# Patient Record
Sex: Female | Born: 1970
Health system: Southern US, Community
[De-identification: ages and names within clinical notes are randomized; demographics above are authoritative.]

## PROBLEM LIST (undated history)

## (undated) DIAGNOSIS — I10 Essential (primary) hypertension: Secondary | ICD-10-CM

## (undated) HISTORY — PX: TONSILLECTOMY: SUR1361

---

## 2018-02-02 DIAGNOSIS — J3089 Other allergic rhinitis: Secondary | ICD-10-CM | POA: Diagnosis not present

## 2018-02-02 DIAGNOSIS — J019 Acute sinusitis, unspecified: Secondary | ICD-10-CM | POA: Diagnosis not present

## 2018-10-15 DIAGNOSIS — T148XXA Other injury of unspecified body region, initial encounter: Secondary | ICD-10-CM | POA: Diagnosis not present

## 2018-10-19 DIAGNOSIS — N912 Amenorrhea, unspecified: Secondary | ICD-10-CM | POA: Diagnosis not present

## 2018-10-19 DIAGNOSIS — R69 Illness, unspecified: Secondary | ICD-10-CM | POA: Diagnosis not present

## 2018-10-19 DIAGNOSIS — S39011D Strain of muscle, fascia and tendon of abdomen, subsequent encounter: Secondary | ICD-10-CM | POA: Diagnosis not present

## 2018-10-19 DIAGNOSIS — Z01411 Encounter for gynecological examination (general) (routine) with abnormal findings: Secondary | ICD-10-CM | POA: Diagnosis not present

## 2018-10-19 DIAGNOSIS — Z01419 Encounter for gynecological examination (general) (routine) without abnormal findings: Secondary | ICD-10-CM | POA: Diagnosis not present

## 2019-01-04 DIAGNOSIS — R69 Illness, unspecified: Secondary | ICD-10-CM | POA: Diagnosis not present

## 2019-11-28 DIAGNOSIS — Z01419 Encounter for gynecological examination (general) (routine) without abnormal findings: Secondary | ICD-10-CM | POA: Diagnosis not present

## 2020-01-14 DIAGNOSIS — I10 Essential (primary) hypertension: Secondary | ICD-10-CM | POA: Diagnosis not present

## 2020-07-29 DIAGNOSIS — I10 Essential (primary) hypertension: Secondary | ICD-10-CM | POA: Diagnosis not present

## 2020-08-05 DIAGNOSIS — R519 Headache, unspecified: Secondary | ICD-10-CM | POA: Diagnosis not present

## 2020-08-06 ENCOUNTER — Emergency Department (INDEPENDENT_AMBULATORY_CARE_PROVIDER_SITE_OTHER): Payer: 59

## 2020-08-06 ENCOUNTER — Emergency Department
Admission: RE | Admit: 2020-08-06 | Discharge: 2020-08-06 | Disposition: A | Discharge status: Home / Self Care | Payer: Self-pay | Source: Ambulatory Visit | Attending: Family Medicine | Admitting: Family Medicine

## 2020-08-06 ENCOUNTER — Other Ambulatory Visit: Payer: Self-pay

## 2020-08-06 VITALS — BP 150/102 | HR 89 | Temp 98.9°F | Resp 16 | Ht 71.0 in | Wt 125.0 lb

## 2020-08-06 DIAGNOSIS — M542 Cervicalgia: Secondary | ICD-10-CM | POA: Diagnosis not present

## 2020-08-06 DIAGNOSIS — R519 Headache, unspecified: Secondary | ICD-10-CM

## 2020-08-06 DIAGNOSIS — M4802 Spinal stenosis, cervical region: Secondary | ICD-10-CM | POA: Diagnosis not present

## 2020-08-06 DIAGNOSIS — G44209 Tension-type headache, unspecified, not intractable: Secondary | ICD-10-CM | POA: Diagnosis not present

## 2020-08-06 HISTORY — DX: Essential (primary) hypertension: I10

## 2020-08-06 NOTE — Discharge Instructions (Addendum)
May take Tylenol once or twice daily as needed.  Try applying a heating pad once or twice daily.

## 2020-08-06 NOTE — ED Provider Notes (Signed)
[facility]    CSN: [id number] Arrival date & time: 08/06/20  0941      History   Chief Complaint Chief Complaint  Patient presents with  . Headache    appt 10AM    HPI Madison Mccall is a 48 y.o. female.   Patient complains of three week history of right neck and right occipital headache. She recalls no injury to her neck.  She denies neurologic symptoms.  Her pain is improved somewhat after taking Ibuprofen and Tylenol.  The history is provided by the patient.  Headache Pain location:  Occipital Quality:  Dull Radiates to:  R neck Onset quality:  Gradual Duration:  3 weeks Timing:  Constant Progression:  Unchanged Chronicity:  New Similar to prior headaches: no   Context: activity   Context: not exposure to bright light, not coughing, not defecating, not eating and not straining   Relieved by:  Acetaminophen and NSAIDs Exacerbated by: neck movement. Ineffective treatments:  None tried Associated symptoms: neck pain and neck stiffness   Associated symptoms: no back pain, no blurred vision, no dizziness, no ear pain, no eye pain, no fatigue, no fever, no focal weakness, no hearing loss, no loss of balance, no myalgias, no nausea, no near-syncope, no numbness, no paresthesias, no photophobia, no seizures, no swollen glands, no syncope, no tingling, no visual change and no weakness     Past Medical History:  Diagnosis Date  . Hypertension     There are no problems to display for this patient.   Past Surgical History:  Procedure Laterality Date  . TONSILLECTOMY      OB History   No obstetric history on file.      Home Medications    Prior to Admission medications   Medication Sig Start Date End Date Taking? Authorizing Provider  levonorgestrel (MIRENA) 20 MCG/24HR IUD 1 each by Intrauterine route once.   Yes [provider]  hydrochlorothiazide (MICROZIDE) 12.5 MG capsule Take 12.5 mg by mouth daily. 07/29/20   [provider]     Family History Family History  Problem Relation Age of Onset  . Hypertension Mother     Social History Social History   Tobacco Use  . Smoking status: Never Smoker  . Smokeless tobacco: Never Used  Vaping Use  . Vaping Use: Never used  Substance Use Topics  . Alcohol use: Never  . Drug use: Never     Allergies   Amoxicillin-pot clavulanate and Sulfa antibiotics   Review of Systems Review of Systems  Constitutional: Negative for activity change, chills, fatigue and fever.  HENT: Negative for ear pain and hearing loss.   Eyes: Negative for blurred vision, photophobia, pain and visual disturbance.  Respiratory: Negative.   Cardiovascular: Negative.  Negative for syncope and near-syncope.  Gastrointestinal: Negative.  Negative for nausea.  Genitourinary: Negative.   Musculoskeletal: Positive for neck pain and neck stiffness. Negative for back pain and myalgias.  Skin: Negative.   Neurological: Positive for headaches. Negative for dizziness, tremors, focal weakness, seizures, syncope, facial asymmetry, speech difficulty, weakness, light-headedness, numbness, paresthesias and loss of balance.     Physical Exam Triage Vital Signs ED Triage Vitals  Enc Vitals Group     BP 08/06/20 1000 (!) 150/102     Pulse Rate 08/06/20 1000 89     Resp 08/06/20 1000 16     Temp 08/06/20 1000 98.9 F (37.2 C)     Temp Source 08/06/20 1000 Oral     SpO2  08/06/20 1000 97 %     Weight 08/06/20 0954 125 lb (56.7 kg)     Height 08/06/20 0954 5\' 11"  (1.803 m)     Head Circumference --      Peak Flow --      Pain Score 08/06/20 0954 5     Pain Loc --      Pain Edu? --      Excl. in GC? --    No data found.  Updated Vital Signs BP (!) 150/102 (BP Location: Right Arm)   Pulse 89   Temp 98.9 F (37.2 C) (Oral)   Resp 16   Ht 5\' 11"  (1.803 m)   Wt 56.7 kg   SpO2 97%   BMI 17.43 kg/m   Visual Acuity Right Eye Distance:   Left Eye Distance:   Bilateral Distance:     Right Eye Near:   Left Eye Near:    Bilateral Near:     Physical Exam Vitals and nursing note reviewed.  Constitutional:      General: She is not in acute distress.    Appearance: She is not ill-appearing.  HENT:     Head: Normocephalic.      Comments: Point tenderness to palpation right occipital area extending to right posterior neck as noted on diagram.  No skull defects palpated.  Patient's pain is increased when she extends her neck, flexes her neck to the left, and rotates her head to the left.    Right Ear: Tympanic membrane, ear canal and external ear normal.     Left Ear: Tympanic membrane, ear canal and external ear normal.     Nose: Nose normal.     Mouth/Throat:     Pharynx: Oropharynx is clear.  Eyes:     Extraocular Movements: Extraocular movements intact.     Conjunctiva/sclera: Conjunctivae normal.     Pupils: Pupils are equal, round, and reactive to light.  Neck:     Vascular: No carotid bruit.  Cardiovascular:     Rate and Rhythm: Normal rate and regular rhythm.     Heart sounds: Normal heart sounds.  Pulmonary:     Breath sounds: Normal breath sounds.  Abdominal:     Palpations: Abdomen is soft.     Tenderness: There is no abdominal tenderness.  Musculoskeletal:        General: No tenderness.     Cervical back: Normal range of motion and neck supple.     Right lower leg: No edema.     Left lower leg: No edema.  Lymphadenopathy:     Cervical: No cervical adenopathy.  Skin:    General: Skin is warm and dry.     Findings: No rash.  Neurological:     Mental Status: She is alert.     Cranial Nerves: Cranial nerves are intact.     Sensory: Sensation is intact.     Motor: Motor function is intact.     Coordination: Coordination is intact.     Gait: Gait is intact.     Deep Tendon Reflexes: Reflexes are normal and symmetric.      UC Treatments / Results  Labs (all labs ordered are listed, but only abnormal results are displayed) Labs Reviewed  - No data to display  EKG   Radiology DG Cervical Spine Complete  Result Date: 08/06/2020 CLINICAL DATA:  49 year old female with pain and tenderness in the occipital region EXAM: CERVICAL SPINE - COMPLETE 4+ VIEW COMPARISON:  None. FINDINGS: Cervical  Spine: Cervical elements maintain relative anatomic alignment from the level of C1-T1. Unremarkable appearance of the craniocervical junction. No subluxation, anterolisthesis, retrolisthesis. Oblique images demonstrate no significant foraminal narrowing. No acute fracture line identified. Vertebral body heights maintained. Mild disc space narrowing. No significant uncovertebral joint disease or anterior osteophytosis. No significant facet disease. Open mouth odontoid view and submental vertex view unremarkable. Calcification within the posterior soft tissues of the neck overlying C5, nonspecific not related to acute fracture. Prevertebral soft tissues within normal limits. IMPRESSION: Negative for acute fracture or malalignment of the cervical spine. Electronically Signed   By: Gilmer Mor D.O.   On: 08/06/2020 12:15   CT Head Wo Contrast  Result Date: 08/06/2020 CLINICAL DATA:  Headache, new or worsening. EXAM: CT HEAD WITHOUT CONTRAST TECHNIQUE: Contiguous axial images were obtained from the base of the skull through the vertex without intravenous contrast. COMPARISON:  None. FINDINGS: Brain: No evidence of acute large vascular territory infarction, hemorrhage, hydrocephalus, extra-axial collection or mass lesion/mass effect. Cavum septum pellucidum et vergae, anatomic variant. Vascular: No hyperdense vessel or unexpected calcification. Skull: No acute fracture. Sinuses/Orbits: Visualized sinuses are clear. Unremarkable visualized orbits. Other: No mastoid effusions. IMPRESSION: No evidence of acute intracranial abnormality. Electronically Signed   By: Feliberto Harts MD   On: 08/06/2020 11:09    Procedures Procedures (including critical care  time)  Medications Ordered in UC Medications - No data to display  Initial Impression / Assessment and Plan / UC Course  I have reviewed the triage vital signs and the nursing notes.  Pertinent labs & imaging results that were available during my care of the patient were reviewed by me and considered in my medical decision making (see chart for details).    Review of records reveals no previous complaints of headache. Patient's pain pattern and exam are somewhat suggestive of cervical radiculopathy.  However, note unremarkable C-spine films. Followup with Family Doctor if not improved in about two weeks.   Final Clinical Impressions(s) / UC Diagnoses   Final diagnoses:  Tension headache     Discharge Instructions     May take Tylenol once or twice daily as needed.  Try applying a heating pad once or twice daily.    ED Prescriptions    None        Lattie Haw, MD 08/11/20 Madison Mccall

## 2020-08-06 NOTE — ED Triage Notes (Addendum)
Pt c/o HA on the back RT side of her head x 3 wks. Taken Advil and Tylenol when pain is worst with some relief. Reports mildly elevated BP and started HCTZ on Monday. Reports nosebleed today,but related that to the dry weather.

## 2020-09-19 DIAGNOSIS — Z20822 Contact with and (suspected) exposure to covid-19: Secondary | ICD-10-CM | POA: Diagnosis not present

## 2020-09-21 DIAGNOSIS — R0981 Nasal congestion: Secondary | ICD-10-CM | POA: Diagnosis not present

## 2020-09-21 DIAGNOSIS — M791 Myalgia, unspecified site: Secondary | ICD-10-CM | POA: Diagnosis not present

## 2020-09-21 DIAGNOSIS — Z20822 Contact with and (suspected) exposure to covid-19: Secondary | ICD-10-CM | POA: Diagnosis not present

## 2021-02-18 DIAGNOSIS — Z01419 Encounter for gynecological examination (general) (routine) without abnormal findings: Secondary | ICD-10-CM | POA: Diagnosis not present

## 2021-03-06 DIAGNOSIS — Z1231 Encounter for screening mammogram for malignant neoplasm of breast: Secondary | ICD-10-CM | POA: Diagnosis not present

## 2021-09-14 ENCOUNTER — Emergency Department
Admission: RE | Admit: 2021-09-14 | Discharge: 2021-09-14 | Disposition: A | Discharge status: Home / Self Care | Payer: 59 | Source: Ambulatory Visit

## 2021-09-14 ENCOUNTER — Emergency Department (INDEPENDENT_AMBULATORY_CARE_PROVIDER_SITE_OTHER): Payer: 59

## 2021-09-14 ENCOUNTER — Other Ambulatory Visit: Payer: Self-pay

## 2021-09-14 VITALS — BP 144/91 | HR 95 | Temp 98.5°F | Resp 14

## 2021-09-14 DIAGNOSIS — R109 Unspecified abdominal pain: Secondary | ICD-10-CM

## 2021-09-14 DIAGNOSIS — S39012A Strain of muscle, fascia and tendon of lower back, initial encounter: Secondary | ICD-10-CM | POA: Diagnosis not present

## 2021-09-14 DIAGNOSIS — M545 Low back pain, unspecified: Secondary | ICD-10-CM | POA: Diagnosis not present

## 2021-09-14 DIAGNOSIS — R3915 Urgency of urination: Secondary | ICD-10-CM | POA: Diagnosis not present

## 2021-09-14 LAB — POCT URINALYSIS DIP (MANUAL ENTRY)
Bilirubin, UA: NEGATIVE
Blood, UA: NEGATIVE
Glucose, UA: NEGATIVE mg/dL
Ketones, POC UA: NEGATIVE mg/dL
Leukocytes, UA: NEGATIVE
Nitrite, UA: NEGATIVE
Protein Ur, POC: NEGATIVE mg/dL
Spec Grav, UA: 1.015 (ref 1.010–1.025)
Urobilinogen, UA: 0.2 E.U./dL
pH, UA: 7 (ref 5.0–8.0)

## 2021-09-14 MED ORDER — BACLOFEN 10 MG PO TABS: 10.0000 mg | ORAL_TABLET | Freq: Three times a day (TID) | ORAL | 0 refills | Status: DC

## 2021-09-14 NOTE — ED Triage Notes (Signed)
Pt presents with lower back pain and urinary urgency that began 1.5 weeks ago. Pt states she also has a knot in her abdomen

## 2021-09-14 NOTE — ED Provider Notes (Signed)
[facility]    CSN: [id number] Arrival date & time: 09/14/21  0944      History   Chief Complaint Chief Complaint  Patient presents with   APPT 10AM   Urinary Urgency   Back Pain    HPI Madison Mccall is a 50 y.o. female.   HPI 50 year old female presents with lower back pain and urinary urgency that began 1.5 weeks ago.  Reports she has also has a knot in her abdomen.   Past Medical History:  Diagnosis Date   Hypertension     There are no problems to display for this patient.   Past Surgical History:  Procedure Laterality Date   TONSILLECTOMY      OB History   No obstetric history on file.      Home Medications    Prior to Admission medications   Medication Sig Start Date End Date Taking? Authorizing Provider  baclofen (LIORESAL) 10 MG tablet Take 1 tablet (10 mg total) by mouth 3 (three) times daily. 09/14/21  Yes Trevor Iha, FNP  hydrochlorothiazide (MICROZIDE) 12.5 MG capsule Take 12.5 mg by mouth daily. 07/29/20   [provider]  levonorgestrel (MIRENA) 20 MCG/24HR IUD 1 each by Intrauterine route once.    [provider]    Family History Family History  Problem Relation Age of Onset   Hypertension Mother     Social History Social History   Tobacco Use   Smoking status: Never   Smokeless tobacco: Never  Vaping Use   Vaping Use: Never used  Substance Use Topics   Alcohol use: Never   Drug use: Never     Allergies   Amoxicillin-pot clavulanate and Sulfa antibiotics   Review of Systems Review of Systems  Genitourinary:  Positive for urgency.  All other systems reviewed and are negative.   Physical Exam Triage Vital Signs ED Triage Vitals  Enc Vitals Group     BP 09/14/21 0956 (!) 144/91     Pulse Rate 09/14/21 0956 95     Resp 09/14/21 0956 14     Temp 09/14/21 0956 98.5 F (36.9 C)     Temp Source 09/14/21 0956 Oral     SpO2 09/14/21 0956 96 %     Weight --      Height --      Head  Circumference --      Peak Flow --      Pain Score 09/14/21 0958 0     Pain Loc --      Pain Edu? --      Excl. in GC? --    No data found.  Updated Vital Signs BP (!) 144/91 (BP Location: Right Arm)   Pulse 95   Temp 98.5 F (36.9 C) (Oral)   Resp 14   SpO2 96%    Physical Exam Vitals and nursing note reviewed.  Constitutional:      Appearance: Normal appearance. She is normal weight.  HENT:     Head: Normocephalic and atraumatic.     Mouth/Throat:     Mouth: Mucous membranes are moist.     Pharynx: Oropharynx is clear.  Eyes:     Extraocular Movements: Extraocular movements intact.     Conjunctiva/sclera: Conjunctivae normal.     Pupils: Pupils are equal, round, and reactive to light.  Cardiovascular:     Rate and Rhythm: Normal rate and regular rhythm.     Pulses: Normal pulses.     Heart sounds: Normal heart  sounds.  Pulmonary:     Effort: Pulmonary effort is normal.     Breath sounds: Normal breath sounds.  Abdominal:     General: Abdomen is flat. Bowel sounds are normal. There is no distension.     Palpations: Abdomen is soft. There is no mass.     Tenderness: There is right CVA tenderness and left CVA tenderness. There is no guarding or rebound.     Hernia: No hernia is present.     Comments: Bilateral CVA tenderness is very mild per patient  Musculoskeletal:        General: Normal range of motion.  Skin:    General: Skin is warm and dry.  Neurological:     General: No focal deficit present.     Mental Status: She is alert and oriented to person, place, and time. Mental status is at baseline.     UC Treatments / Results  Labs (all labs ordered are listed, but only abnormal results are displayed) Labs Reviewed  URINE CULTURE  POCT URINALYSIS DIP (MANUAL ENTRY)    EKG   Radiology DG Lumbar Spine Complete  Result Date: 09/14/2021 CLINICAL DATA:  Flank pain. EXAM: LUMBAR SPINE - COMPLETE 4+ VIEW COMPARISON:  None. FINDINGS: Alignment is  anatomic. Vertebral body and disc space height are maintained. No definite pars defects. IMPRESSION: No findings to explain the patient's flank pain. Electronically Signed   By: Leanna Battles M.D.   On: 09/14/2021 10:59   DG Abdomen 1 View  Result Date: 09/14/2021 CLINICAL DATA:  Bilateral flank pain and urinary urgency for a few weeks. EXAM: ABDOMEN - 1 VIEW COMPARISON:  None. FINDINGS: No radiopaque calculi project over the renal outlines or expected course of the ureters. Intrauterine contraceptive device is in place. Calcified lesion in the anatomic pelvis may represent a fibroid. Visualized lung bases are clear. IMPRESSION: No radiopaque urinary calculi. Electronically Signed   By: Leanna Battles M.D.   On: 09/14/2021 10:57    Procedures Procedures (including critical care time)  Medications Ordered in UC Medications - No data to display  Initial Impression / Assessment and Plan / UC Course  I have reviewed the triage vital signs and the nursing notes.  Pertinent labs & imaging results that were available during my care of the patient were reviewed by me and considered in my medical decision making (see chart for details).     MDM: 1.  Flank pain-KUB was negative for radiopaque urinary calculi; 2.  Strain of lumbar region, initial encounter-x-ray of LS spine was negative for acute osseous process, Rx Baclofen. Advised patient of KUB and LS-spine results.  Advised patient to take medication as directed with food.  Advised patient we will follow-up with urine culture results once received.  Advised patient we will follow-up with urine culture results once received.  Patient discharged home, hemodynamically stable. Final Clinical Impressions(s) / UC Diagnoses   Final diagnoses:  Flank pain  Strain of lumbar region, initial encounter     Discharge Instructions      Advised patient of KUB and LS-spine results.  Advised patient to take medication as directed with food.  Advised  patient we will follow-up with urine culture results once received.  Advised patient we will follow-up with urine culture results once received.     ED Prescriptions     Medication Sig Dispense Auth. Provider   baclofen (LIORESAL) 10 MG tablet Take 1 tablet (10 mg total) by mouth 3 (three) times daily. 30 each  Trevor Iha, FNP      PDMP not reviewed this encounter.   Trevor Iha, FNP 09/14/21 1218

## 2021-09-14 NOTE — Discharge Instructions (Addendum)
Advised patient of KUB and LS-spine results.  Advised patient to take medication as directed with food.  Advised patient we will follow-up with urine culture results once received.  Advised patient we will follow-up with urine culture results once received.

## 2021-09-15 LAB — URINE CULTURE
MICRO NUMBER:: 12744360
Result:: NO GROWTH
SPECIMEN QUALITY:: ADEQUATE

## 2021-10-23 DIAGNOSIS — R072 Precordial pain: Secondary | ICD-10-CM | POA: Insufficient documentation

## 2022-03-20 IMAGING — DX DG LUMBAR SPINE COMPLETE 4+V
5 series · 5 of 5 positions shown · non-contrast
Comparison: None.

CLINICAL DATA: Flank pain.

EXAM:
LUMBAR SPINE - COMPLETE 4+ VIEW

[l-spine ap]
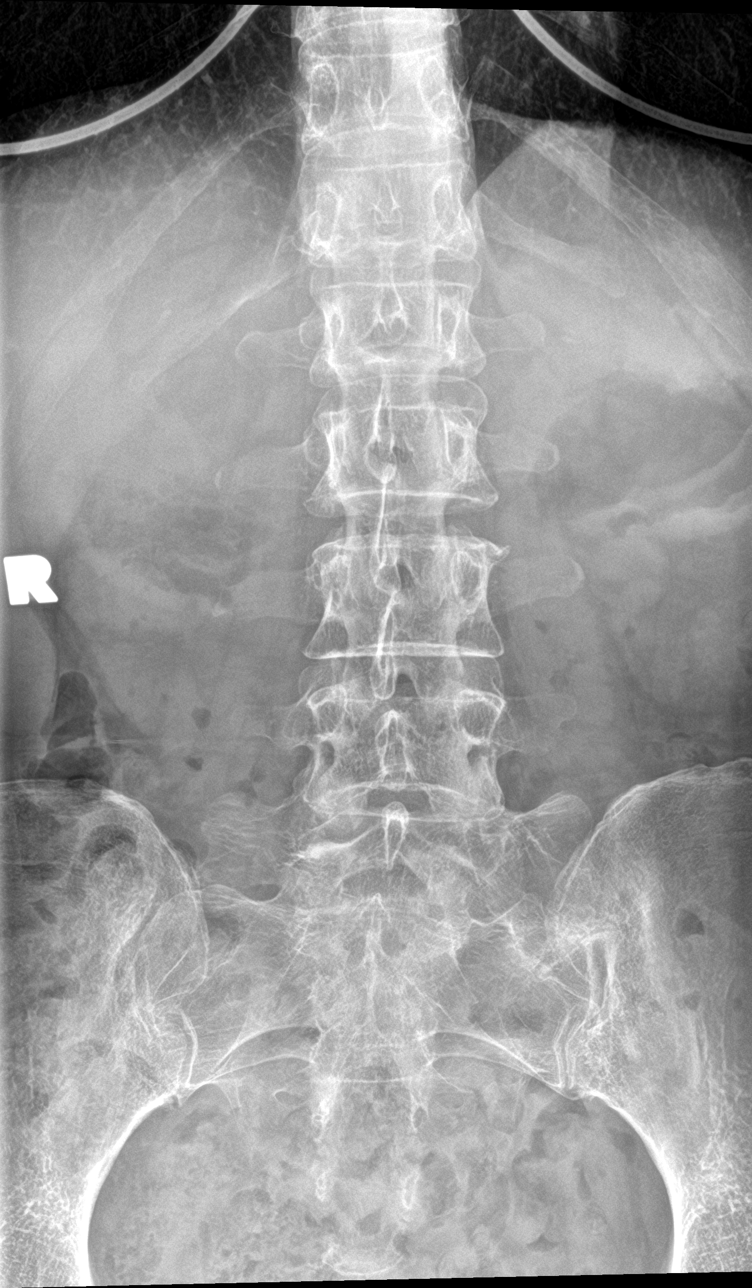

[l-spine obl (1 of 2)]
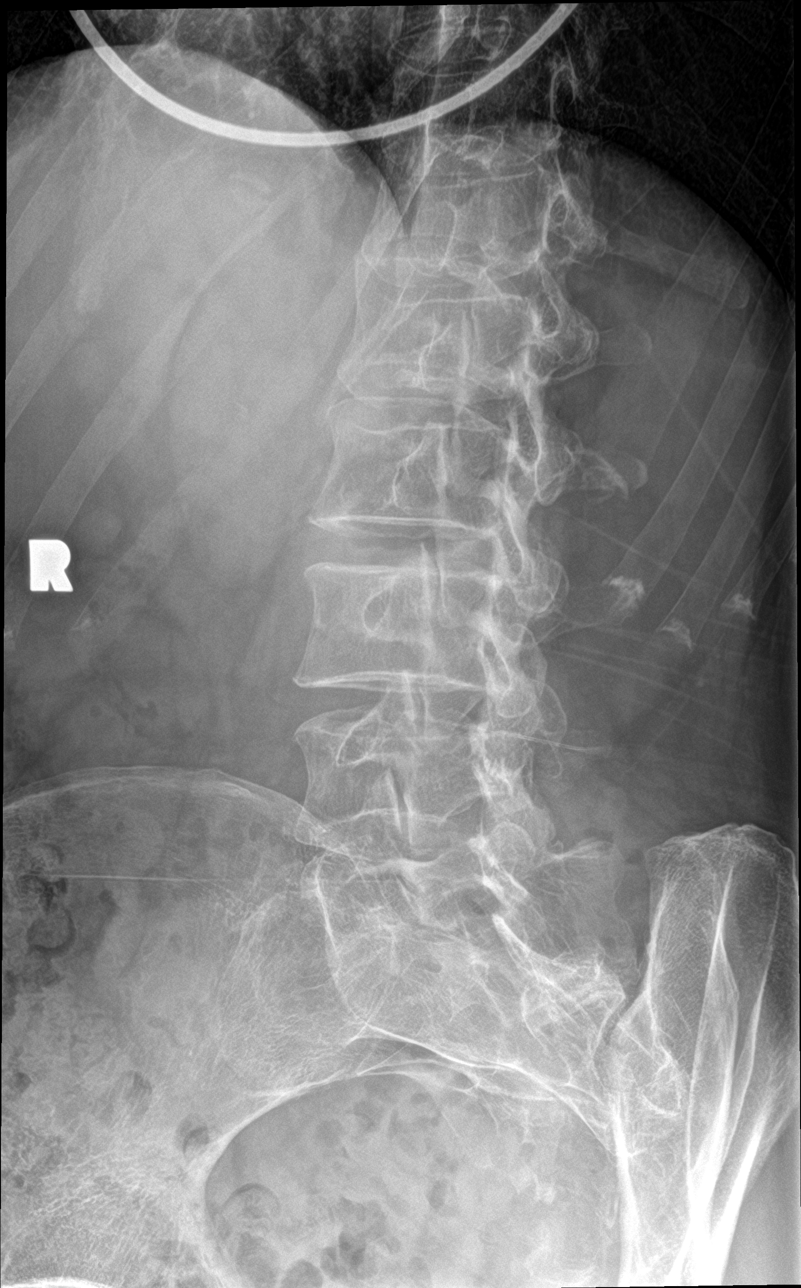

[l-spine obl (2 of 2)]
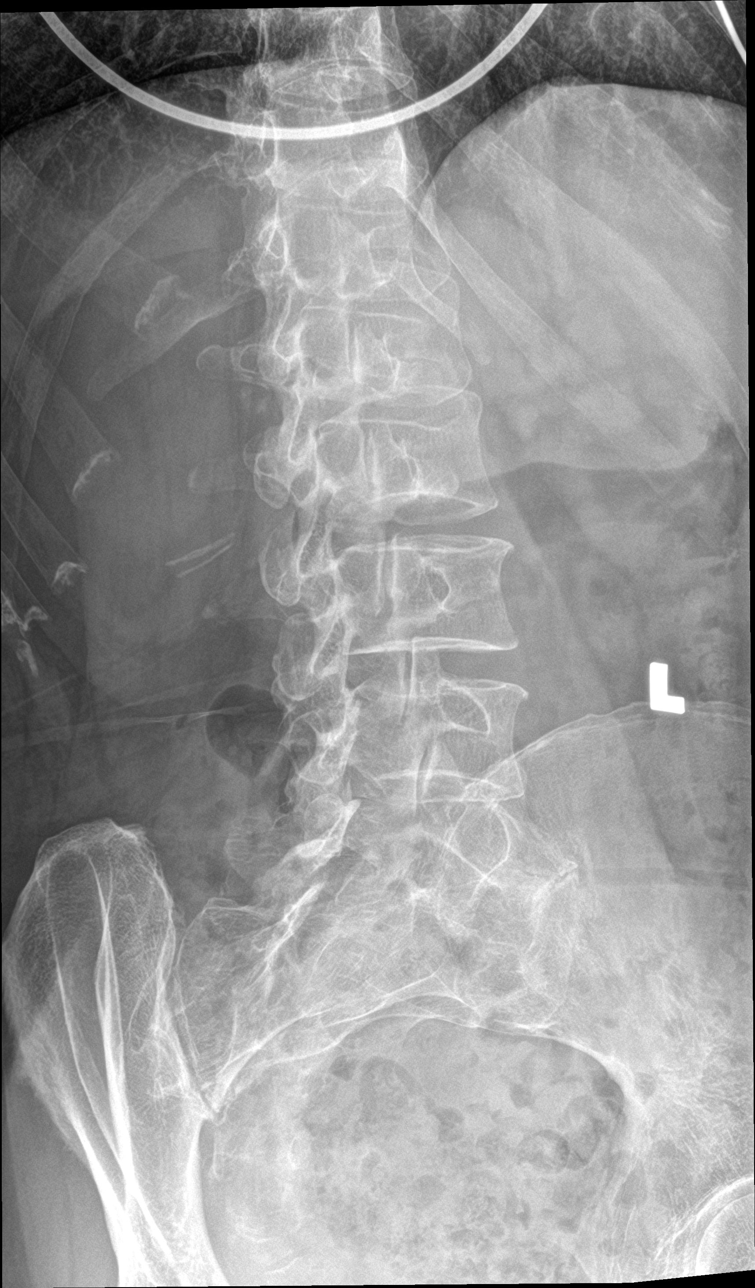

[l-spine lat]
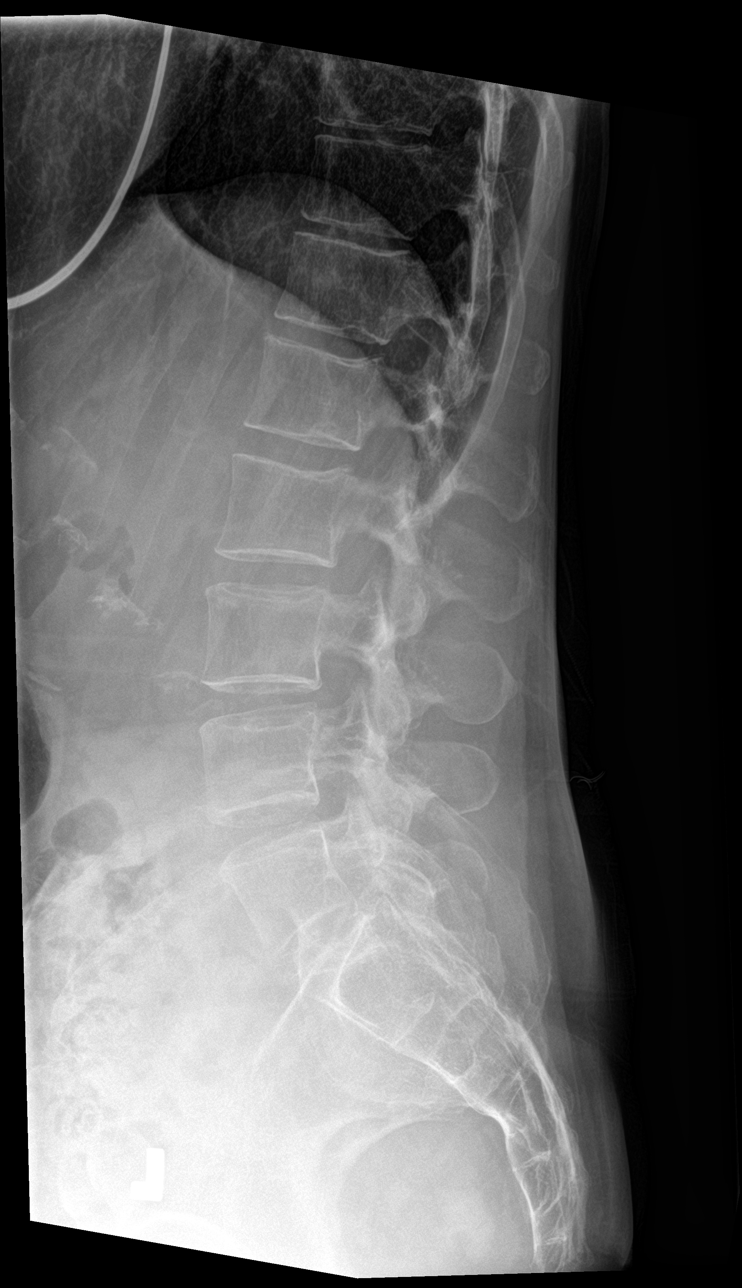

[l-spine spot]
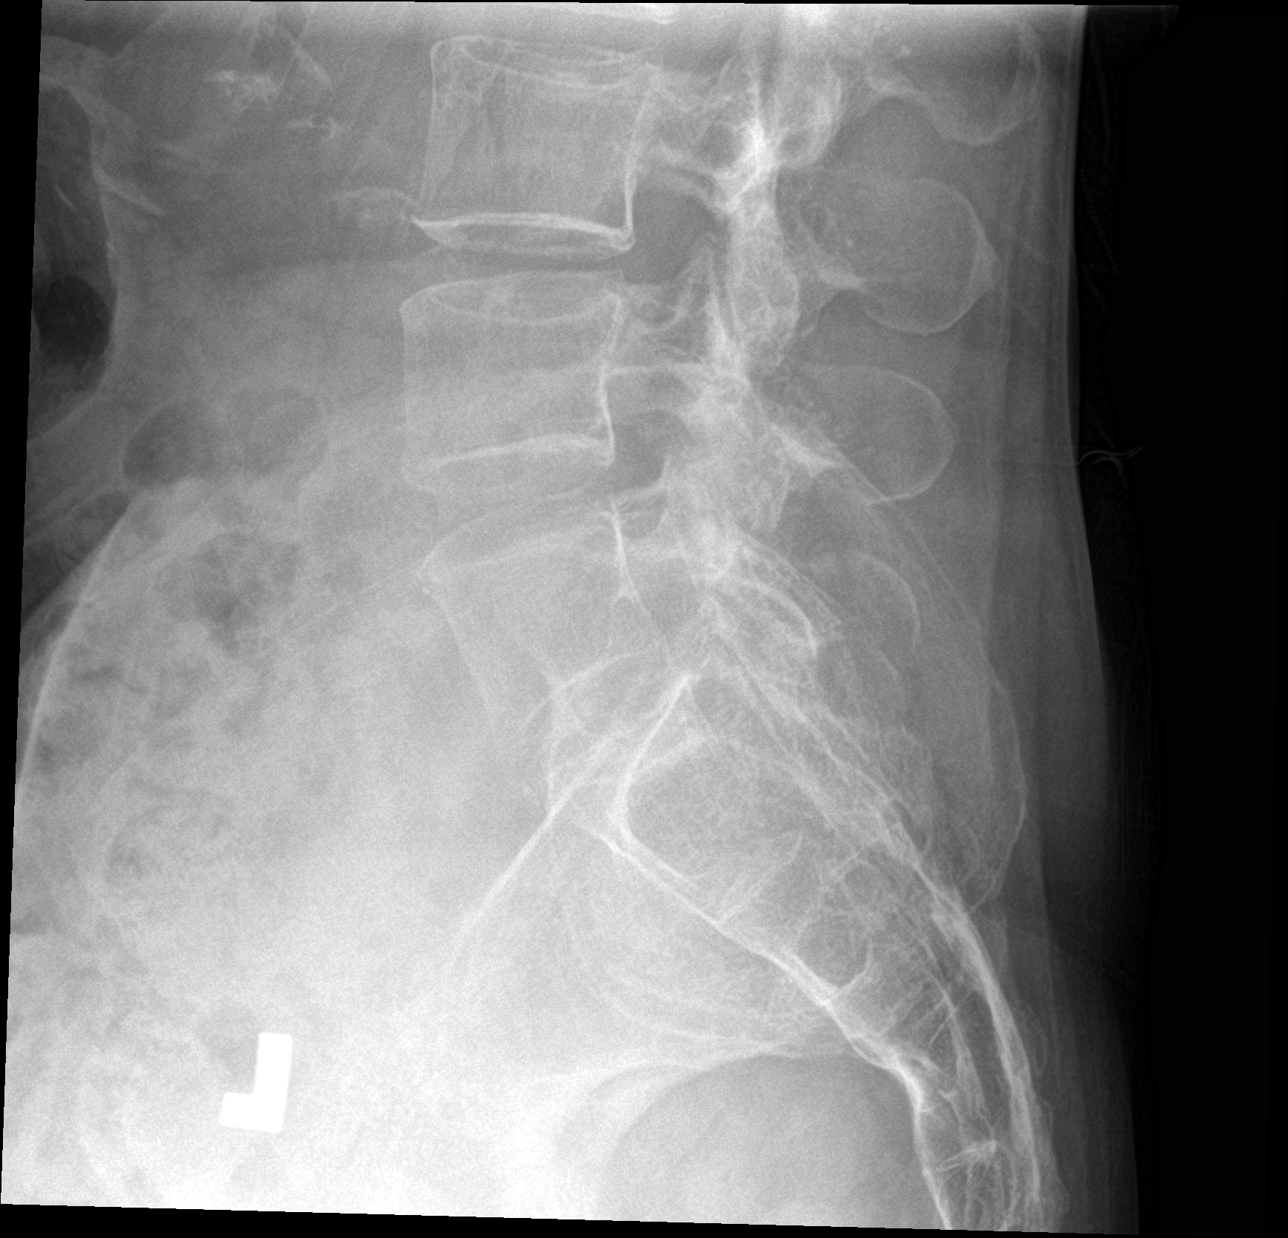

[5 of 5 positions shown; findings below may reference images not displayed]

FINDINGS: Alignment is anatomic. Vertebral body and disc space height are
maintained. No definite pars defects.
IMPRESSION: No findings to explain the patient's flank pain.

## 2023-12-15 DIAGNOSIS — H57812 Brow ptosis, left: Secondary | ICD-10-CM | POA: Insufficient documentation

## 2024-04-05 ENCOUNTER — Telehealth: Payer: Self-pay

## 2024-04-05 NOTE — Telephone Encounter (Signed)
 Not a patient at [facility]   Copied from CRM [id number]. Topic: Appointments - Appointment Cancel/Reschedule >> Apr 05, 2024 11:42 AM Rosaria BRAVO wrote: Patient/patient representative is calling to cancel or reschedule an appointment. Refer to attachments for appointment information.

## 2024-04-09 ENCOUNTER — Ambulatory Visit: Admitting: Family Medicine

## 2024-04-19 ENCOUNTER — Ambulatory Visit (INDEPENDENT_AMBULATORY_CARE_PROVIDER_SITE_OTHER): Admitting: Family Medicine

## 2024-04-19 ENCOUNTER — Encounter: Payer: Self-pay | Admitting: Family Medicine

## 2024-04-19 VITALS — BP 134/85 | HR 80 | Temp 98.7°F | Ht 71.0 in | Wt 137.0 lb

## 2024-04-19 DIAGNOSIS — S46812A Strain of other muscles, fascia and tendons at shoulder and upper arm level, left arm, initial encounter: Secondary | ICD-10-CM | POA: Insufficient documentation

## 2024-04-19 DIAGNOSIS — I10 Essential (primary) hypertension: Secondary | ICD-10-CM | POA: Diagnosis not present

## 2024-04-19 DIAGNOSIS — H57812 Brow ptosis, left: Secondary | ICD-10-CM

## 2024-04-19 DIAGNOSIS — Z7689 Persons encountering health services in other specified circumstances: Secondary | ICD-10-CM

## 2024-04-19 DIAGNOSIS — R000 Tachycardia, unspecified: Secondary | ICD-10-CM | POA: Diagnosis not present

## 2024-04-19 DIAGNOSIS — J029 Acute pharyngitis, unspecified: Secondary | ICD-10-CM | POA: Insufficient documentation

## 2024-04-19 NOTE — Assessment & Plan Note (Signed)
 Taking hydrochlorothiazide 12.5 mg daily. No side effects reported. Well controlled in the office. CMP today. No refills needed.

## 2024-04-19 NOTE — Assessment & Plan Note (Signed)
 Left eye lid drooping. No vision changes. No headaches. Up to date on eye exam. Continue to monitor. No red flags observed. Neuro exam normal.

## 2024-04-19 NOTE — Progress Notes (Signed)
 New Patient Office Visit  Subjective    Patient ID: Madison Mccall, female    DOB: Jul 15, 1971  Age: 52 y.o. MRN: [id number]  CC:  Chief Complaint  Patient presents with   Establish Care    HPI Madison Mccall presents to establish care with this practice. She is new to me. She would like to discuss:   Left ptosis:  Left eye lid sagging more than right.  No vision changes.    Left trapezius muscle soreness.  Present for 2 months.  Started new job in June which does not require movement in her left arm.  Tylenol helped.  Heart rate elevates in the heat. Has record of heart rates 80-97. Has seen cards in the past 2 years ago for chest pressure. Stress test normal.   HTN: Taking hydrochlorothiazide 12.5 mg  daily. Well controlled in office today.    Chart review: 12/15/23: [facility]  Annual wellness Left brow ptosis, follow-up with plastic surgery.      Outpatient Encounter Medications as of 04/19/2024  Medication Sig   hydrochlorothiazide (MICROZIDE) 12.5 MG capsule Take 12.5 mg by mouth daily.   levonorgestrel (MIRENA) 20 MCG/24HR IUD 1 each by Intrauterine route once.   [DISCONTINUED] baclofen  (LIORESAL ) 10 MG tablet Take 1 tablet (10 mg total) by mouth 3 (three) times daily.   No facility-administered encounter medications on file as of 04/19/2024.    Past Medical History:  Diagnosis Date   Hypertension     Past Surgical History:  Procedure Laterality Date   TONSILLECTOMY      Family History  Problem Relation Age of Onset   Hypertension Mother     Social History   Socioeconomic History   Marital status: Married    Spouse name: Not on file   Number of children: Not on file   Years of education: Not on file   Highest education level: Not on file  Occupational History   Not on file  Tobacco Use   Smoking status: Never   Smokeless tobacco: Never  Vaping Use   Vaping status: Never Used  Substance and Sexual Activity   Alcohol use: Never   Drug  use: Never   Sexual activity: Not on file  Other Topics Concern   Not on file  Social History Narrative   Not on file   Social Drivers of Health   Financial Resource Strain: Patient Declined (04/17/2024)   Overall Financial Resource Strain (CARDIA)    Difficulty of Paying Living Expenses: Patient declined  Food Insecurity: Patient Declined (04/17/2024)   Hunger Vital Sign    Worried About Running Out of Food in the Last Year: Patient declined    Ran Out of Food in the Last Year: Patient declined  Transportation Needs: Patient Declined (04/17/2024)   PRAPARE - Administrator, Civil Service (Medical): Patient declined    Lack of Transportation (Non-Medical): Patient declined  Physical Activity: Insufficiently Active (04/17/2024)   Exercise Vital Sign    Days of Exercise per Week: 2 days    Minutes of Exercise per Session: 20 min  Stress: No Stress Concern Present (04/17/2024)   [employer] of Occupational Health - Occupational Stress Questionnaire    Feeling of Stress: Only a little  Social Connections: Unknown (04/17/2024)   Social Connection and Isolation Panel    Frequency of Communication with Friends and Family: Patient declined    Frequency of Social Gatherings with Friends and Family: Patient declined    Attends Religious Services: Patient  declined    Active Member of Clubs or Organizations: Patient declined    Attends Banker Meetings: Not on file    Marital Status: Patient declined  Intimate Partner Violence: Not At Risk (12/15/2023)   Received from [facility]   HITS    Over the last 12 months how often did your partner physically hurt you?: Never    Over the last 12 months how often did your partner insult you or talk down to you?: Never    Over the last 12 months how often did your partner threaten you with physical harm?: Never    Over the last 12 months how often did your partner scream or curse at you?: Never    Review of Systems   Eyes:  Negative for blurred vision and double vision.  Respiratory:  Negative for shortness of breath.   Cardiovascular:  Negative for chest pain.  Neurological:  Negative for dizziness and headaches.        Objective    BP 134/85 (BP Location: Left Arm, Patient Position: Sitting, Cuff Size: Normal)   Pulse 80   Temp 98.7 F (37.1 C) (Oral)   Ht 5' 11 (1.803 m)   Wt 137 lb (62.1 kg)   LMP  (LMP Unknown)   SpO2 99%   BMI 19.11 kg/m   Physical Exam Vitals and nursing note reviewed.  Constitutional:      General: She is not in acute distress.    Appearance: Normal appearance. She is normal weight.  Cardiovascular:     Rate and Rhythm: Normal rate and regular rhythm.     Heart sounds: Normal heart sounds.  Pulmonary:     Effort: Pulmonary effort is normal.     Breath sounds: Normal breath sounds.  Skin:    General: Skin is warm and dry.  Neurological:     General: No focal deficit present.     Mental Status: She is alert. Mental status is at baseline.     Cranial Nerves: Cranial nerves 2-12 are intact.     Sensory: Sensation is intact.     Motor: Motor function is intact.     Coordination: Coordination is intact. Coordination normal. Finger-Nose-Finger Test and Heel to [facility] Test normal.     Gait: Gait is intact.  Psychiatric:        Mood and Affect: Mood normal.        Behavior: Behavior normal.        Thought Content: Thought content normal.        Judgment: Judgment normal.       Assessment & Plan:   Problem List Items Addressed This Visit     Brow ptosis, left   Left eye lid drooping. No vision changes. No headaches. Up to date on eye exam. Continue to monitor. No red flags observed. Neuro exam normal.       Establishing care with new doctor, encounter for - Primary   Essential hypertension   Taking hydrochlorothiazide 12.5 mg daily. No side effects reported. Well controlled in the office. CMP today. No refills needed.       Relevant  Orders   Comprehensive metabolic panel with GFR   Sore throat   No rapid strep performed today.  No sore throat reported.       Relevant Orders   POCT rapid strep A (Completed)   Tachycardia   Reports history of heart rate elevations when out in the heat.  She monitors her blood pressure  and heart rate at home. She will monitor when this occurs and what activity she was doing at the time. She has seen cardiology in the past with normal stress test.  Follow-up as needed from home monitoring.       Strain of left trapezius muscle   Present for 2 months. No injury. ROM in neck intact. Supportive therapy with ice and heat. Topical rubs. Tylenol or ibuprofen as needed. Massage therapy may also help.      Strep test was not performed on this patient. Agrees with plan of care discussed.  Questions answered.  Total face to face time discussing concerns and reviewing plan: 40 minutes   Return if symptoms worsen or fail to improve.   Darice JONELLE Brownie, FNP

## 2024-04-19 NOTE — Assessment & Plan Note (Signed)
 Reports history of heart rate elevations when out in the heat.  She monitors her blood pressure and heart rate at home. She will monitor when this occurs and what activity she was doing at the time. She has seen cardiology in the past with normal stress test.  Follow-up as needed from home monitoring.

## 2024-04-19 NOTE — Assessment & Plan Note (Signed)
 Present for 2 months. No injury. ROM in neck intact. Supportive therapy with ice and heat. Topical rubs. Tylenol or ibuprofen as needed. Massage therapy may also help.

## 2024-04-19 NOTE — Assessment & Plan Note (Addendum)
 No rapid strep performed today.  No sore throat reported.

## 2024-04-20 ENCOUNTER — Ambulatory Visit: Payer: Self-pay | Admitting: Family Medicine

## 2024-04-20 LAB — COMPREHENSIVE METABOLIC PANEL WITH GFR
ALT: 14 IU/L (ref 0–32)
AST: 23 IU/L (ref 0–40)
Albumin: 5.2 g/dL — ABNORMAL HIGH (ref 3.9–4.9)
Alkaline Phosphatase: 67 IU/L (ref 44–121)
BUN/Creatinine Ratio: 16 (ref 9–23)
BUN: 11 mg/dL (ref 6–24)
Bilirubin Total: 0.9 mg/dL (ref 0.0–1.2)
CO2: 25 mmol/L (ref 20–29)
Calcium: 10.3 mg/dL — ABNORMAL HIGH (ref 8.7–10.2)
Chloride: 98 mmol/L (ref 96–106)
Creatinine, Ser: 0.68 mg/dL (ref 0.57–1.00)
Globulin, Total: 2.5 g/dL (ref 1.5–4.5)
Glucose: 96 mg/dL (ref 70–99)
Potassium: 5.2 mmol/L (ref 3.5–5.2)
Sodium: 140 mmol/L (ref 134–144)
Total Protein: 7.7 g/dL (ref 6.0–8.5)
eGFR: 106 mL/min/1.73 (ref >59)

## 2024-04-24 NOTE — Progress Notes (Signed)
 Order(s) created erroneously. Erroneous order ID: [id number]  Order moved by: CHART CORRECTION ANALYST SEVEN, IDENTITY  Order move date/time: 04/24/2024 9:52 AM  Source Patient: [id number]  Source Contact: 04/19/2024  Destination Patient: [id number]  Destination Contact: 08/06/2021

## 2024-05-30 ENCOUNTER — Encounter: Payer: Self-pay | Admitting: Family Medicine

## 2024-05-30 ENCOUNTER — Ambulatory Visit (INDEPENDENT_AMBULATORY_CARE_PROVIDER_SITE_OTHER): Admitting: Family Medicine

## 2024-05-30 VITALS — BP 125/80 | HR 82 | Temp 98.7°F | Ht 71.0 in | Wt 138.0 lb

## 2024-05-30 DIAGNOSIS — S46811A Strain of other muscles, fascia and tendons at shoulder and upper arm level, right arm, initial encounter: Secondary | ICD-10-CM | POA: Insufficient documentation

## 2024-05-30 MED ORDER — CYCLOBENZAPRINE HCL 5 MG PO TABS: 5.0000 mg | ORAL_TABLET | Freq: Every day | ORAL | 0 refills | Status: DC

## 2024-05-30 NOTE — Progress Notes (Signed)
 Established Patient Office Visit  Subjective   Patient ID: Madison Mccall, female    DOB: 1971/02/04  Age: 53 y.o. MRN: [id number]  Chief Complaint  Patient presents with   Headache    Couple of weeks, on the right side in the back. Hurts when she turns her head to the right and her top of her shoulders are sore when she pushes down. Pain is back behind her right ear. Pain is pretty constant if she rubs it.    Headache on right side of neck.  Sensitive with palpation that sparks the headache. Worse with turning head to right. Improved some since last week.  No trauma or injury to that side of head. No bruising or rash.  Taking advil, which helped some. Symptoms off and on for 2 weeks. No dizziness or lightheadedness .        ROS    Objective:     BP 125/80   Pulse 82   Temp 98.7 F (37.1 C) (Oral)   Ht 5' 11 (1.803 m)   Wt 138 lb (62.6 kg)   SpO2 99%   BMI 19.25 kg/m    Physical Exam Vitals and nursing note reviewed.  Constitutional:      General: She is not in acute distress.    Appearance: Normal appearance.  Cardiovascular:     Rate and Rhythm: Normal rate and regular rhythm.  Pulmonary:     Effort: Pulmonary effort is normal.     Breath sounds: Normal breath sounds.  Skin:    General: Skin is warm and dry.     Comments: No bruising or rash.   Neurological:     General: No focal deficit present.     Mental Status: She is alert and oriented to person, place, and time. Mental status is at baseline.     GCS: GCS eye subscore is 4. GCS verbal subscore is 5. GCS motor subscore is 6.     Cranial Nerves: No facial asymmetry.     Sensory: Sensation is intact.     Motor: Motor function is intact. No weakness.     Coordination: Coordination is intact. Coordination normal. Finger-Nose-Finger Test and Heel to [facility] Test normal.     Gait: Gait is intact.  Psychiatric:        Mood and Affect: Mood normal.        Behavior: Behavior normal.        Thought  Content: Thought content normal.        Judgment: Judgment normal.      No results found for any visits on 05/30/24.    The ASCVD Risk score (Arnett DK, et al., 2019) failed to calculate for the following reasons:   Cannot find a previous HDL lab   Cannot find a previous total cholesterol lab    Assessment & Plan:   Problem List Items Addressed This Visit     Trapezius muscle strain, right, initial encounter - Primary   Points to area on right side of head where she has had headache and tenderness to palpation. There is not known injury to head. No ecchymosis or rash. Reports pain in the right trapezius muscle, worse with turning her head to right. Symptoms have improved since last week. Advill 400 mg worked some. No dizziness or lightheadedness. Neurologically intact in office. Equal grip strength. Recommend increasing dose of Ibuprofen to 600 mg every 6 hours with food or 800 mg every 8  hours with food. Flexeril  5  mg as needed at night.  Alternate ice and heat. Will purchase an ice pack to wear around her shoulders/neck. Follow-up if symptoms do not improve.        Relevant Medications   cyclobenzaprine  (FLEXERIL ) 5 MG tablet  Agrees with plan of care discussed.  Questions answered.   Return if symptoms worsen or fail to improve.    Darice JONELLE Brownie, FNP

## 2024-05-30 NOTE — Assessment & Plan Note (Addendum)
 Points to area on right side of head where she has had headache and tenderness to palpation. There is not known injury to head. No ecchymosis or rash. Reports pain in the right trapezius muscle, worse with turning her head to right. Symptoms have improved since last week. Advill 400 mg worked some. No dizziness or lightheadedness. Neurologically intact in office. Equal grip strength. Recommend increasing dose of Ibuprofen to 600 mg every 6 hours with food or 800 mg every 8  hours with food. Flexeril  5 mg as needed at night.  Alternate ice and heat. Will purchase an ice pack to wear around her shoulders/neck. Follow-up if symptoms do not improve.

## 2024-07-20 ENCOUNTER — Ambulatory Visit: Payer: Self-pay

## 2024-07-20 NOTE — Telephone Encounter (Signed)
 Patient reporting mild swelling to left knee and bilateral elbows for the past 2 weeks. Patient is scheduled for an acute visit at alternative location on 07/24/2024 at 8:50 AM  FYI Only or Action Required?: FYI only for provider.  Patient was last seen in primary care on 05/30/2024 by Booker Darice SAUNDERS, FNP.  Called Nurse Triage reporting Joint Swelling.  Symptoms began 2 weeks ago.  Interventions attempted: Rest, hydration, or home remedies.  Symptoms are: unchanged.  Triage Disposition: See PCP When Office is Open (Within 3 Days)  Patient/caregiver understands and will follow disposition?: Yes  Copied from CRM #[id number]. Topic: Clinical - Red Word Triage >> Jul 20, 2024  1:14 PM Wess RAMAN wrote: Red Word that prompted transfer to Nurse Triage: Elbow/joint area sore as well as knee area sore for about 2 weeks. Swelling with both knees and elbows. Difficulty holding a jug of water or a stanley cup. Reason for Disposition  MILD or MODERATE swelling (e.g., can't move joint normally, can't do usual activities) (Exceptions: Itchy, localized swelling; swelling is chronic.)  MILD OR MODERATE joint swelling (e.g., feels or looks mildly swollen or puffy)  Answer Assessment - Initial Assessment Questions 1. LOCATION: Where is the swelling? (e.g., left, right, both elbows)     Both elbows 2. SIZE and DESCRIPTION: What does the swelling look like? (e.g., entire elbow, localized)     Localized swelling to the elbow 3. ONSET: When did the swelling start? Does it come and go, or is it there all the time?     Started two weeks ago 4. WORK OR EXERCISE: Has there been any recent work, exercise or other activity that involved that part of the body?      no 5. AGGRAVATING FACTORS: What makes the elbow swelling worse? (e.g., work, sports activities)     Trying to life items 6. ASSOCIATED SYMPTOMS: Is there any pain or redness?     pain 7. OTHER SYMPTOMS: Do you have any other symptoms?  (e.g., fever)     no  Answer Assessment - Initial Assessment Questions 1. LOCATION: Where is the swelling located?  (e.g., left, right, both knees)     Left knee 2. ONSET: When did the swelling start? Does it come and go, or is it there all the time?     2 weeks ago 3. SWELLING: How bad is the swelling? Or, How large is it? (e.g., mild, moderate, severe; size of localized swelling)      mild 4. PAIN: Is there any pain? If Yes, ask: How bad is it? (Scale 0-10; or none, mild, moderate, severe)     Mild 5. SETTING: Has there been any recent work, exercise or other activity that involved that part of the body?      no 6. AGGRAVATING FACTORS: What makes the knee swelling worse? (e.g., walking, climbing stairs, running)     Patient states nothing makes it worse 7. ASSOCIATED SYMPTOMS: Is there any pain or redness?     pain 8. OTHER SYMPTOMS: Do you have any other symptoms? (e.g., calf pain, chest pain, difficulty breathing, fever)     no  Protocols used: Elbow Swelling-A-AH, Knee Swelling-A-AH

## 2024-07-24 ENCOUNTER — Ambulatory Visit (INDEPENDENT_AMBULATORY_CARE_PROVIDER_SITE_OTHER): Admitting: Physician Assistant

## 2024-07-24 ENCOUNTER — Encounter: Payer: Self-pay | Admitting: Physician Assistant

## 2024-07-24 VITALS — BP 136/92 | HR 83 | Ht 71.0 in | Wt 141.0 lb

## 2024-07-24 DIAGNOSIS — M129 Arthropathy, unspecified: Secondary | ICD-10-CM | POA: Diagnosis not present

## 2024-07-24 DIAGNOSIS — M25521 Pain in right elbow: Secondary | ICD-10-CM | POA: Diagnosis not present

## 2024-07-24 DIAGNOSIS — M25522 Pain in left elbow: Secondary | ICD-10-CM

## 2024-07-24 DIAGNOSIS — Z8261 Family history of arthritis: Secondary | ICD-10-CM | POA: Diagnosis not present

## 2024-07-24 DIAGNOSIS — M25562 Pain in left knee: Secondary | ICD-10-CM | POA: Insufficient documentation

## 2024-07-24 MED ORDER — DICLOFENAC SODIUM 1 % EX GEL: 4.0000 g | Freq: Four times a day (QID) | CUTANEOUS | 1 refills | Status: DC

## 2024-07-24 NOTE — Patient Instructions (Signed)
 Hamstring Strain Rehab Ask your health care provider which exercises are safe for you. Do exercises exactly as told by your health care provider and adjust them as directed. It is normal to feel mild stretching, pulling, tightness, or discomfort as you do these exercises. Stop right away if you feel sudden pain or your pain gets worse. Do not begin these exercises until told by your health care provider. Stretching and range-of-motion exercises These exercises warm up your muscles and joints and improve the movement and flexibility of your thighs. These exercises also help to relieve pain, numbness, and tingling. Talk to your health care provider about these restrictions. Knee extension, seated  Sit with your left / right heel propped on a chair, a coffee table, or a footstool. Do not have anything under your knee to support it. Allow your leg muscles to relax, letting gravity straighten out your knee (extension). You should feel a stretch behind your left / right knee. If told by your health care provider, deepen the stretch by placing a __________ weight on your thigh, just above your kneecap. Hold this position for __________ seconds. Repeat __________ times. Complete this exercise __________ times a day. Seated stretch This exercise is sometimes called hamstrings and adductors stretch. Sit on the floor with your legs stretched wide. Keep your knees straight during this exercise. Keeping your head and back in a straight line, bend at your waist to reach for your left foot. You should feel a stretch in your right inner thigh (adductors). Hold this position for __________ seconds. Then slowly return to the upright position. Keeping your head and back in a straight line, bend at your waist to reach forward. You should feel a stretch behind both of your thighs or knees (hamstrings). Hold this position for __________ seconds. Then slowly return to the upright position. Keeping your head and back in a  straight line, bend at your waist to reach for your right foot. You should feel a stretch in your left inner thigh (adductors). Hold this position for __________ seconds. Then slowly return to the upright position. Repeat __________ times. Complete this exercise __________ times a day. Hamstrings stretch, supine  Lie on your back (supine position). Loop a belt or towel over the ball of your left / right foot. The ball of your foot is on the walking surface, right under your toes. Straighten your left / right knee and slowly pull on the belt or towel to raise your leg. Do not let your left / right knee bend while you do this. Keep your other leg flat on the floor. Raise the left / right leg until you feel a gentle stretch behind your left / right knee or thigh (hamstrings). Hold this position for __________ seconds. Slowly return your leg to the starting position. Repeat __________ times. Complete this exercise __________ times a day. Strengthening exercises These exercises build strength and endurance in your thighs. Endurance is the ability to use your muscles for a long time, even after they get tired. Straight leg raises, prone This exercise strengthens the muscles that move the hips (hip extensors). Lie on your abdomen on a firm surface (prone position). Tense the muscles in your buttocks and lift your left / right leg about 4 inches (10 cm). Keep your knee straight as you lift your leg. If you cannot lift your leg that high without arching your back, place a pillow under your hips. Hold the position for __________ seconds. Slowly lower your leg to  the starting position. Allow your muscles to relax completely before you start the next repetition. Repeat __________ times. Complete this exercise __________ times a day. Bridge This exercise strengthens the muscles in your buttocks and the back of your thighs (hip extensors). Lie on your back on a firm surface with your knees bent and your  feet flat on the floor. Tighten your buttocks muscles and lift your bottom off the floor until the trunk of your body is level with your thighs. You should feel the muscles working in your buttocks and the back of your thighs. Do not arch your back. Hold this position for __________ seconds. Slowly lower your hips to the starting position. Let your buttocks muscles relax completely between repetitions. If told by your health care provider, keep your bottom lifted off the floor while you slowly walk your feet away from you as far as you can control. Hold for __________ seconds, then slowly walk your feet back toward you. Repeat __________ times. Complete this exercise __________ times a day. Lateral walking with band This is an exercise in which you walk sideways (lateral), with tension provided by an exercise band. The exercise strengthens the muscles in your hip (hip abductors). Stand in a long hallway. Wrap a loop of exercise band around your legs, just above your knees. Bend your knees gently and drop your hips down and back so your weight is over your heels. Step to the side to move down the length of the hallway, keeping your toes pointed ahead of you and keeping tension in the band. Repeat, leading with your other leg. Repeat __________ times. Complete this exercise __________ times a day. Single leg stand with reaching This exercise is also called eccentric hamstring stretch. Stand on your left / right foot. Keep your big toe down on the floor and try to keep your arch lifted. Slowly reach down toward the floor as far as you can while keeping your balance. Lowering your thigh under tension is called eccentric stretching. Hold this position for __________ seconds. Repeat __________ times. Complete this exercise __________ times a day. Plank, prone This exercise strengthens muscles in your abdomen and core area. Lie on your abdomen on the floor (prone position),and prop yourself up on  your elbows. Your hands should be straight out in front of you, and your elbows should be below your shoulders. Position your feet similar to a push-up position so your toes are on the ground. Tighten your abdominal muscles and lift your body off the floor. Do not arch your back. Do not hold your breath. Hold this position for __________ seconds. Repeat __________ times. Complete this exercise __________ times a day. This information is not intended to replace advice given to you by your health care provider. Make sure you discuss any questions you have with your health care provider. Document Revised: 01/24/2023 Document Reviewed: 03/16/2021 Elsevier Patient Education  2024 [employer] Exercises for [employer] Elbow Elbow exercises can help you get better if you have golfer's elbow. Only do the exercises you were told to do. Make sure you know how to do the exercises safely. Follow the steps below. It's normal to feel mild discomfort. Stop if you feel pain or your pain gets worse. Do not start these exercises until told by your health care provider. Stretching and range-of-motion exercises These exercises warm up your muscles and joints. They can help your elbow move better and be more flexible. Wrist extension, assisted  Straighten your left / right elbow  in front of you with your palm facing up toward the ceiling. If told by your provider, bend your left / right elbow to a 90-degree angle (right angle) at your side. Do this instead of holding it straight. With your other hand, gently pull your left / right hand and fingers toward the floor. Stop when you feel a gentle stretch on the palm side of your forearm. Hold this position for __________ seconds. Repeat __________ times. Do this exercise __________ times a day. Wrist flexion, assisted  Straighten your left / right elbow in front of you with your palm facing down toward the floor. If told by your provider, bend your left / right elbow  to a 90-degree angle at your side. Do this instead of holding it straight. With your other hand, gently push over the back of your left / right hand so your fingers point toward the floor. Stop when you feel a gentle stretch on the back of your forearm. Hold this position for __________ seconds. Repeat __________ times. Do this exercise __________ times a day. Assisted forearm rotation, supination  Sit or stand with your elbows at your side. Bend your left / right elbow to a 90-degree angle. Using your uninjured hand, turn your left / right palm up toward the ceiling. Stop when you feel a gentle stretch along the inside of your forearm. Hold this position for __________ seconds. Repeat __________ times. Do this exercise __________ times a day. Assisted forearm rotation, pronation  Sit or stand with your elbows at your side. Bend your left / right elbow to a 90-degree angle. Using your uninjured hand, turn your left / right palm down toward the floor. Stop when you feel a gentle stretch along the top of your forearm. Hold this position for __________ seconds. Repeat __________ times. Do this exercise __________ times a day. Strengthening exercises These exercises build strength and endurance in your elbow. Endurance is the ability to use your muscles for a long time, even after they get tired. Wrist flexion  Sit with your left / right forearm supported on a table or other surface. Turn your palm up toward the ceiling. Let your left / right wrist extend over the edge of the surface. Hold a __________ weight or a piece of rubber exercise band or tubing. If using a rubber exercise band or tubing, hold the other end of the tubing with your other hand. Slowly bend your wrist so your hand moves up toward the ceiling. Try to only move your wrist. Keep the rest of your arm still. Hold this position for __________ seconds. Slowly go back to the starting position. Repeat __________ times. Do this  exercise __________ times a day. Wrist flexion, eccentric  Sit with your left / right forearm supported on a table or other surface. Turn your palm up toward the ceiling. Let your left / right wrist extend over the edge of the surface. Hold a __________ weight or a piece of rubber exercise band or tubing in your left / right hand. If using a rubber exercise band or tubing, hold the other end of the tubing with your other hand. Use your uninjured hand to move your left / right hand up toward the ceiling. Take your other hand away. Slowly go back to the starting position using only your left / right hand. Repeat __________ times. Do this exercise __________ times a day. Forearm rotation, pronation To do this exercise, you'll need a lightweight hammer or rubber mallet. Sit  with your left / right forearm supported on a table or other surface. Bend your elbow to a 90-degree angle. Have your forearm so that your palm is facing up toward the ceiling, with your hand resting over the edge of the table. Hold a hammer in your left / right hand. To make this exercise easier, hold the hammer near the head of the hammer. To make this exercise harder, hold the hammer near the end of the handle. Without moving your elbow, slowly turn your forearm so your palm faces down toward the floor. Hold this position for __________ seconds. Slowly return to the starting position. Repeat __________ times. Do this exercise __________ times a day. Shoulder blade squeeze     Sit in a stable chair or stand with good posture. If you're sitting down, don't let your back touch the back of the chair. Your arms should be at your sides with your elbows bent to a 90-degree angle. Position your forearms so that your thumbs are facing the ceiling. Without lifting your shoulders up, squeeze your shoulder blades tightly together. Hold this position for __________ seconds. Slowly release. Go back to the starting position. Repeat  __________ times. Do this exercise __________ times a day. This information is not intended to replace advice given to you by your health care provider. Make sure you discuss any questions you have with your health care provider. Document Revised: 04/14/2023 Document Reviewed: 04/14/2023 Elsevier Patient Education  2024 [employer].

## 2024-07-24 NOTE — Progress Notes (Signed)
 Acute Office Visit  Subjective:     Patient ID: Madison Mccall, female    DOB: 02-Mar-1971, 53 y.o.   MRN: [id number]  Chief Complaint  Patient presents with   Joint Swelling    HPI .SABRADiscussed the use of AI scribe software for clinical note transcription with the patient, who gave verbal consent to proceed.  History of Present Illness Madison Mccall is a 53 year old female who presents with posterior medial left knee pain and bilateral medial elbow pain for last month.   Left knee pain - Intermittent pain for the past three weeks - Occurs approximately every two weeks - Pain originates in the medial aspect of the knee and sometimes radiates to the calf - Onset of soreness after cleaning during a beach trip three weeks ago - No recent falls or muscle strains - Ibuprofen provides minimal relief - No significant increase in physical activity prior to onset  Right and left medial elbow tenderness and swelling - Tenderness and a raised area present for the past three weeks - Intermittent pain - Tenderness localized to a specific area - Described as a sensation similar to a pulled muscle rather than a cramp  Family history of arthritis - Grandmother diagnosed with rheumatoid arthritis - Possible history of rheumatoid arthritis in mother - Sister experiences arthritis-related symptoms in legs and back  Physical activity level - Limited physical activity - Primarily walks her dog and makes occasional trips to the store - No recent significant increase in physical activity  Prior laboratory evaluation - No prior autoimmune workup - Blood work performed in July for general health and menopause-related concerns    ROS See HPI.      Objective:    BP (!) 136/92   Pulse 83   Ht 5' 11 (1.803 m)   Wt 141 lb (64 kg)   SpO2 99%   BMI 19.67 kg/m  BP Readings from Last 3 Encounters:  07/24/24 (!) 136/92  05/30/24 125/80  04/19/24 134/85   Wt Readings from Last 3  Encounters:  07/24/24 141 lb (64 kg)  05/30/24 138 lb (62.6 kg)  04/19/24 137 lb (62.1 kg)      Physical Exam Constitutional:      Appearance: Normal appearance.  HENT:     Head: Normocephalic.  Cardiovascular:     Rate and Rhythm: Normal rate.  Pulmonary:     Effort: Pulmonary effort is normal.  Musculoskeletal:     Right lower leg: No edema.     Left lower leg: No edema.     Comments: Left knee:  No joint swelling, redness, warmth or tenderness.  No pain to palpation over joint space or bony landmarks.  No palpapation of mass in posterior knee.  Pain seems to be coming from hamstring medial insertion  Tenderness over medial epicondyle of both elbows. No swelling, warmth, redness.   Neurological:     General: No focal deficit present.     Mental Status: She is alert and oriented to person, place, and time.  Psychiatric:        Mood and Affect: Mood normal.          Assessment & Plan:  Madison Mccall was seen today for joint swelling.  Diagnoses and all orders for this visit:  Posterior knee pain, left -     CMP14+EGFR -     TSH + free T4 -     CBC w/Diff/Platelet -     Sed Rate (ESR) -  C-reactive protein -     VITAMIN D 25 Hydroxy (Vit-D Deficiency, Fractures) -     ANA,IFA RA Diag Pnl w/rflx Tit/Patn -     Cyclic citrul peptide antibody, IgG -     Lipid panel -     diclofenac Sodium (VOLTAREN) 1 % GEL; Apply 4 g topically 4 (four) times daily. To affected joint. -     CYCLIC CITRUL PEPTIDE ANTIBODY, IGG/IGA  Arthritis, multiple joint involvement -     CMP14+EGFR -     TSH + free T4 -     CBC w/Diff/Platelet -     Sed Rate (ESR) -     C-reactive protein -     VITAMIN D 25 Hydroxy (Vit-D Deficiency, Fractures) -     ANA,IFA RA Diag Pnl w/rflx Tit/Patn -     Cyclic citrul peptide antibody, IgG -     Lipid panel -     diclofenac Sodium (VOLTAREN) 1 % GEL; Apply 4 g topically 4 (four) times daily. To affected joint. -     CYCLIC CITRUL PEPTIDE ANTIBODY,  IGG/IGA  Bilateral elbow joint pain -     CMP14+EGFR -     TSH + free T4 -     CBC w/Diff/Platelet -     Sed Rate (ESR) -     C-reactive protein -     VITAMIN D 25 Hydroxy (Vit-D Deficiency, Fractures) -     ANA,IFA RA Diag Pnl w/rflx Tit/Patn -     Cyclic citrul peptide antibody, IgG -     Lipid panel -     diclofenac Sodium (VOLTAREN) 1 % GEL; Apply 4 g topically 4 (four) times daily. To affected joint. -     CYCLIC CITRUL PEPTIDE ANTIBODY, IGG/IGA   Assessment & Plan Left posterior medial knee pain/Left calf pain Intermittent left knee pain with medial tenderness unresponsive to ibuprofen. Differential includes hamstring insertion strain or gastrocnemius involvement. Consider activity level and autoimmune factors due to family history of rheumatoid arthritis. Suspect strain of muscles. - Provide exercises for hamstring and gastrocnemius strain. - Provide Voltaren cream for topical anti-inflammatory treatment. - Autoimmune work up ordered today with fasting labs for screening  Bilateral medial epicondylitis Tenderness and swelling likely due to repetitive motion or overuse. - Provide diclofenac gel for topical anti-inflammatory treatment. - Advise rest and ice application. - Provide targeted exercises for tendons. - Consider elbow braces if symptoms persist. - Follow up as needed.     Madison Seda, PA-C

## 2024-07-25 ENCOUNTER — Ambulatory Visit: Payer: Self-pay | Admitting: Physician Assistant

## 2024-07-25 DIAGNOSIS — M129 Arthropathy, unspecified: Secondary | ICD-10-CM

## 2024-07-25 DIAGNOSIS — M25521 Pain in right elbow: Secondary | ICD-10-CM

## 2024-07-25 DIAGNOSIS — Z8261 Family history of arthritis: Secondary | ICD-10-CM

## 2024-07-25 DIAGNOSIS — R7689 Other specified abnormal immunological findings in serum: Secondary | ICD-10-CM

## 2024-07-25 NOTE — Progress Notes (Signed)
 Madison Mccall,   Kidney, liver, glucose look great.  Thyroid normal.  Normal inflammatory markers.  Vitamin D looks great.  Cholesterol looks fabulous.  No signs thus far of any auto-immune arthritis. Treatment plan stays the same.

## 2024-07-26 LAB — ANA,IFA RA DIAG PNL W/RFLX TIT/PATN
ANA Titer 1: POSITIVE — AB
Cyclic Citrullin Peptide Ab: 7 U (ref 0–19)
Rheumatoid fact SerPl-aCnc: <10 [IU]/mL (ref <14.0)

## 2024-07-26 LAB — LIPID PANEL
Chol/HDL Ratio: 2.1 ratio (ref 0.0–4.4)
Cholesterol, Total: 142 mg/dL (ref 100–199)
HDL: 68 mg/dL (ref >39)
LDL Chol Calc (NIH): 62 mg/dL (ref 0–99)
Triglycerides: 57 mg/dL (ref 0–149)
VLDL Cholesterol Cal: 12 mg/dL (ref 5–40)

## 2024-07-26 LAB — CBC WITH DIFFERENTIAL/PLATELET
Basophils Absolute: 0 x10E3/uL (ref 0.0–0.2)
Basos: 1 %
EOS (ABSOLUTE): 0 x10E3/uL (ref 0.0–0.4)
Eos: 1 %
Hematocrit: 40.2 % (ref 34.0–46.6)
Hemoglobin: 13.2 g/dL (ref 11.1–15.9)
Immature Grans (Abs): 0 x10E3/uL (ref 0.0–0.1)
Immature Granulocytes: 0 %
Lymphocytes Absolute: 1.4 x10E3/uL (ref 0.7–3.1)
Lymphs: 29 %
MCH: 31.2 pg (ref 26.6–33.0)
MCHC: 32.8 g/dL (ref 31.5–35.7)
MCV: 95 fL (ref 79–97)
Monocytes Absolute: 0.4 x10E3/uL (ref 0.1–0.9)
Monocytes: 9 %
Neutrophils Absolute: 2.9 x10E3/uL (ref 1.4–7.0)
Neutrophils: 60 %
Platelets: 310 x10E3/uL (ref 150–450)
RBC: 4.23 x10E6/uL (ref 3.77–5.28)
RDW: 11.9 % (ref 11.7–15.4)
WBC: 4.7 x10E3/uL (ref 3.4–10.8)

## 2024-07-26 LAB — CMP14+EGFR
ALT: 15 IU/L (ref 0–32)
AST: 23 IU/L (ref 0–40)
Albumin: 4.7 g/dL (ref 3.8–4.9)
Alkaline Phosphatase: 66 IU/L (ref 49–135)
BUN/Creatinine Ratio: 15 (ref 9–23)
BUN: 10 mg/dL (ref 6–24)
Bilirubin Total: 0.6 mg/dL (ref 0.0–1.2)
CO2: 26 mmol/L (ref 20–29)
Calcium: 9.7 mg/dL (ref 8.7–10.2)
Chloride: 99 mmol/L (ref 96–106)
Creatinine, Ser: 0.65 mg/dL (ref 0.57–1.00)
Globulin, Total: 2.6 g/dL (ref 1.5–4.5)
Glucose: 91 mg/dL (ref 70–99)
Potassium: 4.4 mmol/L (ref 3.5–5.2)
Sodium: 138 mmol/L (ref 134–144)
Total Protein: 7.3 g/dL (ref 6.0–8.5)
eGFR: 107 mL/min/1.73 (ref >59)

## 2024-07-26 LAB — SEDIMENTATION RATE: Sed Rate: 6 mm/h (ref 0–40)

## 2024-07-26 LAB — TSH+FREE T4
Free T4: 1.4 ng/dL (ref 0.82–1.77)
TSH: 1.61 u[IU]/mL (ref 0.450–4.500)

## 2024-07-26 LAB — VITAMIN D 25 HYDROXY (VIT D DEFICIENCY, FRACTURES): Vit D, 25-Hydroxy: 53.1 ng/mL (ref 30.0–100.0)

## 2024-07-26 LAB — C-REACTIVE PROTEIN: CRP: <1 mg/L (ref 0–10)

## 2024-07-26 LAB — FANA STAINING PATTERNS: Speckled Pattern: 1:320 {titer} — ABNORMAL HIGH

## 2024-07-26 NOTE — Progress Notes (Signed)
 Madison Mccall,   Rheumatoid factor is negative but your ANA for antibodies is positive. I would like for you to see rheumatology for further evaluation. Any request?

## 2024-07-27 LAB — CYCLIC CITRUL PEPTIDE ANTIBODY, IGG/IGA: Cyclic Citrullin Peptide Ab: 7 U (ref 0–19)

## 2024-07-27 NOTE — Progress Notes (Signed)
 Attempted call to patient to ask provider question of if she has a preference of who to refer to for rheumatology?

## 2024-08-15 ENCOUNTER — Encounter: Payer: Self-pay | Admitting: Physician Assistant

## 2024-11-06 ENCOUNTER — Encounter: Payer: Self-pay | Admitting: Urgent Care

## 2024-11-06 ENCOUNTER — Ambulatory Visit: Admitting: Urgent Care

## 2024-11-06 VITALS — BP 130/85 | HR 103 | Temp 98.1°F | Ht 71.0 in | Wt 139.0 lb

## 2024-11-06 DIAGNOSIS — R059 Cough, unspecified: Secondary | ICD-10-CM | POA: Diagnosis not present

## 2024-11-06 DIAGNOSIS — R590 Localized enlarged lymph nodes: Secondary | ICD-10-CM

## 2024-11-06 DIAGNOSIS — J22 Unspecified acute lower respiratory infection: Secondary | ICD-10-CM

## 2024-11-06 LAB — POC COVID19/FLU A&B COMBO
Covid Antigen, POC: NEGATIVE
Influenza A Antigen, POC: NEGATIVE
Influenza B Antigen, POC: NEGATIVE

## 2024-11-06 MED ORDER — AZITHROMYCIN 250 MG PO TABS: ORAL_TABLET | ORAL | 0 refills | Status: DC

## 2024-11-06 MED ORDER — PREDNISONE 50 MG PO TABS: ORAL_TABLET | ORAL | 0 refills | Status: DC

## 2024-11-06 MED ORDER — PREDNISONE 50 MG PO TABS: ORAL_TABLET | ORAL | 0 refills | Status: AC

## 2024-11-06 MED ORDER — AZITHROMYCIN 250 MG PO TABS: ORAL_TABLET | ORAL | 0 refills | Status: AC

## 2024-11-06 NOTE — Patient Instructions (Signed)
 Please continue with mucinex and robitussin for the next few days. If in an additional 4 days you are not feeling significant improvement, please start the zpack and prednisone .

## 2024-11-06 NOTE — Progress Notes (Signed)
 "  Established Patient Office Visit  Subjective:  Patient ID: Madison Mccall, female    DOB: 1970/11/18  Age: 54 y.o. MRN: [id number]  Chief Complaint  Patient presents with   Cough    And congestion since Sunday    HPI  Discussed the use of AI scribe software for clinical note transcription with the patient, who gave verbal consent to proceed.  History of Present Illness   Madison Mccall is a 54 year old female who presents with congestion and cough.  Since Sunday, she has been experiencing congestion and tenderness on the left side of her neck. Her symptoms have worsened over the last day, particularly at night, with increased coughing and congestion. She describes her nose as 'stopped up on one side' with some runniness, and the mucus is 'pretty yellowy' but not dark.  She denies sinus pain, ear congestion, shortness of breath, wheezing, chest pain, or tightness. She has been taking Robitussin, which she feels has improved her symptoms, especially at night. Additionally, she is taking Aleve for back pain, which she attributes to sledding recently.     Family members with similar sx, of which their symptoms resolved after receiving tx with azithromycin .   Patient Active Problem List   Diagnosis Date Noted   Posterior knee pain, left 07/24/2024   Arthritis, multiple joint involvement 07/24/2024   Bilateral elbow joint pain 07/24/2024   Family history of rheumatoid arthritis 07/24/2024   Trapezius muscle strain, right, initial encounter 05/30/2024   Establishing care with new doctor, encounter for 04/19/2024   Essential hypertension 04/19/2024   Tachycardia 04/19/2024   Strain of left trapezius muscle 04/19/2024   Brow ptosis, left 12/15/2023   Precordial pain 10/23/2021   Past Medical History:  Diagnosis Date   Hypertension    Past Surgical History:  Procedure Laterality Date   TONSILLECTOMY     Social History[1]    ROS: as noted in HPI  Objective:     BP 130/85    Pulse (!) 103   Temp 98.1 F (36.7 C)   Ht 5' 11 (1.803 m)   Wt 139 lb (63 kg)   SpO2 97%   BMI 19.39 kg/m  BP Readings from Last 3 Encounters:  11/06/24 130/85  07/24/24 (!) 136/92  05/30/24 125/80   Wt Readings from Last 3 Encounters:  11/06/24 139 lb (63 kg)  07/24/24 141 lb (64 kg)  05/30/24 138 lb (62.6 kg)      Physical Exam Vitals and nursing note reviewed.  Constitutional:      General: She is not in acute distress.    Appearance: Normal appearance. She is not ill-appearing, toxic-appearing or diaphoretic.  HENT:     Head: Normocephalic and atraumatic.     Right Ear: Ear canal and external ear normal. No drainage, swelling or tenderness. A middle ear effusion is present. There is no impacted cerumen. Tympanic membrane is not injected, scarred, perforated or erythematous.     Left Ear: Ear canal and external ear normal. No drainage, swelling or tenderness. A middle ear effusion is present. There is no impacted cerumen. Tympanic membrane is not injected, scarred, perforated or erythematous.     Nose: Congestion and rhinorrhea present. Rhinorrhea is purulent.     Right Turbinates: Swollen. Not enlarged.     Left Turbinates: Swollen. Not enlarged.     Right Sinus: No maxillary sinus tenderness or frontal sinus tenderness.     Left Sinus: No maxillary sinus tenderness or frontal sinus tenderness.  Mouth/Throat:     Lips: Pink.     Mouth: Mucous membranes are moist.     Pharynx: Oropharynx is clear. Uvula midline. Postnasal drip present. No pharyngeal swelling, oropharyngeal exudate, posterior oropharyngeal erythema or uvula swelling.  Cardiovascular:     Rate and Rhythm: Normal rate and regular rhythm.  Pulmonary:     Effort: Pulmonary effort is normal. No respiratory distress.     Breath sounds: No stridor. Rhonchi present. No wheezing or rales.  Musculoskeletal:     Cervical back: Normal range of motion and neck supple. No rigidity or tenderness.   Lymphadenopathy:     Cervical: Cervical adenopathy (entire L sided anterior cervical chain) present.  Skin:    General: Skin is warm and dry.     Coloration: Skin is not jaundiced.     Findings: No bruising, erythema or rash.  Neurological:     General: No focal deficit present.     Mental Status: She is alert and oriented to person, place, and time.     Motor: No weakness.     Gait: Gait normal.      Results for orders placed or performed in visit on 11/06/24  POC Covid19/Flu A&B Antigen  Result Value Ref Range   Influenza A Antigen, POC Negative Negative   Influenza B Antigen, POC Negative Negative   Covid Antigen, POC Negative Negative    Last CBC Lab Results  Component Value Date   WBC 4.7 07/24/2024   HGB 13.2 07/24/2024   HCT 40.2 07/24/2024   MCV 95 07/24/2024   MCH 31.2 07/24/2024   RDW 11.9 07/24/2024   PLT 310 07/24/2024      The 10-year ASCVD risk score (Arnett DK, et al., 2019) is: 0.9%  Assessment & Plan:  Cough, unspecified type -     POC Covid19/Flu A&B Antigen  Lower resp. tract infection -     Azithromycin ; Take 2 tablets on day 1, then 1 tablet daily on days 2 through 5  Dispense: 6 tablet; Refill: 0 -     predniSONE ; One tab PO daily for 5 days.  Dispense: 5 tablet; Refill: 0  Cervical lymphadenopathy -     Azithromycin ; Take 2 tablets on day 1, then 1 tablet daily on days 2 through 5  Dispense: 6 tablet; Refill: 0 -     predniSONE ; One tab PO daily for 5 days.  Dispense: 5 tablet; Refill: 0  Assessment and Plan    Acute bronchitis Symptoms suggest viral etiology, with congestion, cough, and lymph node tenderness. No shortness of breath or wheezing. Mucus present on auscultation. - Continue Robitussin, avoid formulations with Sudafed or acetaminophen. - Symptomatic treatment for one week. - Consider starting antibiotics or steroids if symptoms persist over the next few days - covid/ flu test negative        No follow-ups on file.    Benton LITTIE Gave, PA     [1]  Social History Tobacco Use   Smoking status: Never   Smokeless tobacco: Never  Vaping Use   Vaping status: Never Used  Substance Use Topics   Alcohol use: Never   Drug use: Never   "

## 2024-12-24 ENCOUNTER — Ambulatory Visit
